# Patient Record
Sex: Male | Born: 1980 | Race: Black or African American | Hispanic: No | Marital: Single | Smoking: Current every day smoker
Health system: Southern US, Community
[De-identification: ages and names within clinical notes are randomized; demographics above are authoritative.]

---

## 2006-07-10 ENCOUNTER — Emergency Department: Payer: Self-pay | Admitting: Emergency Medicine

## 2006-07-20 ENCOUNTER — Emergency Department: Payer: Self-pay | Admitting: General Practice

## 2006-10-24 ENCOUNTER — Emergency Department: Payer: Self-pay | Admitting: Emergency Medicine

## 2010-09-27 ENCOUNTER — Emergency Department: Payer: Self-pay | Admitting: Emergency Medicine

## 2011-06-17 ENCOUNTER — Emergency Department: Payer: Self-pay | Admitting: Emergency Medicine

## 2019-07-12 ENCOUNTER — Encounter: Payer: Self-pay | Admitting: Emergency Medicine

## 2019-07-12 ENCOUNTER — Emergency Department
Admission: EM | Admit: 2019-07-12 | Discharge: 2019-07-12 | Disposition: A | Discharge status: Home / Self Care | Payer: Self-pay | Attending: Student in an Organized Health Care Education/Training Program | Admitting: Student in an Organized Health Care Education/Training Program

## 2019-07-12 ENCOUNTER — Other Ambulatory Visit: Payer: Self-pay

## 2019-07-12 DIAGNOSIS — L0201 Cutaneous abscess of face: Secondary | ICD-10-CM | POA: Insufficient documentation

## 2019-07-12 DIAGNOSIS — F1210 Cannabis abuse, uncomplicated: Secondary | ICD-10-CM | POA: Insufficient documentation

## 2019-07-12 DIAGNOSIS — F17210 Nicotine dependence, cigarettes, uncomplicated: Secondary | ICD-10-CM | POA: Insufficient documentation

## 2019-07-12 DIAGNOSIS — H01001 Unspecified blepharitis right upper eyelid: Secondary | ICD-10-CM | POA: Insufficient documentation

## 2019-07-12 MED ORDER — CEPHALEXIN 500 MG PO CAPS
500.0000 mg | ORAL_CAPSULE | Freq: Once | ORAL | Status: AC
Start: 2019-07-12 — End: 2019-07-12
  Administered 2019-07-12: 11:00:00 500 mg via ORAL
  Filled 2019-07-12: qty 1

## 2019-07-12 MED ORDER — CEPHALEXIN 500 MG PO CAPS: 500.0000 mg | ORAL_CAPSULE | Freq: Three times a day (TID) | ORAL | 0 refills | Status: DC

## 2019-07-12 NOTE — ED Triage Notes (Signed)
Patient states he thinks he has a boil beside right eye. Swelling noted to right eye. States he woke up yesterday and noticed the swelling. Patient reports he tried keeping a warm rag on it throughout the day yesterday with no improvement. Denies drainage from boil or eye.

## 2019-07-12 NOTE — ED Notes (Signed)
Provider in room to assess patient.  Will continue to monitor.   

## 2019-07-12 NOTE — ED Notes (Signed)
Patient presents to the ED with significant swelling to his right eye. Patient reports minimal pain.  Patient has a small raised area approx. 1/2 inch from his right eye.  Patient states eye swelling started yesterday.  Patient is in no obvious distress.

## 2019-07-12 NOTE — ED Provider Notes (Signed)
[facility] Emergency Department Provider Note ____________________________________________  Time seen: 1035  I have reviewed the triage vital signs and the nursing notes.  HISTORY  Chief Complaint  Eye Pain  HPI Roger Richard is a 38 y.o. male presents to the ED for evaluation of what he thinks is a boil beside his right eye.  Patient noted swelling to the right eye yesterday when he awoke.  He does admit to squeezing the area in an attempt to drain it.  Since that time he has had swelling to the upper lid in the right as well as some early swelling to the infraorbital region.  He denies any eye pain, difficulty seeing, eye drainage, nausea, vomiting, or dizziness.  Please apply warm compress throughout the day of denies any significant improvement.  Patient denies any preceding cough, cold, or flulike symptoms.   History reviewed. No pertinent past medical history.  There are no active problems to display for this patient.  History reviewed. No pertinent surgical history.  Prior to Admission medications   Medication Sig Start Date End Date Taking? Authorizing Provider  cephALEXin (KEFLEX) 500 MG capsule Take 1 capsule (500 mg total) by mouth 3 (three) times daily. 07/12/19   Roger Richard, Roger Karvonen, PA-C    Allergies Patient has no known allergies.  No family history on file.  Social History Social History   Tobacco Use  . Smoking status: Current Every Day Smoker    Packs/day: 0.50  . Smokeless tobacco: Never Used  Substance Use Topics  . Alcohol use: Yes    Comment: daily  . Drug use: Yes    Types: Marijuana    Review of Systems  Constitutional: Negative for fever. Eyes: Negative for visual changes.  Upper lid swelling on the right. ENT: Negative for sore throat. Cardiovascular: Negative for chest pain. Respiratory: Negative for shortness of breath. Gastrointestinal: Negative for abdominal pain, vomiting and diarrhea. Genitourinary:  Negative for dysuria. Musculoskeletal: Negative for back pain. Skin: Negative for rash.  Facial nodule is noted. Neurological: Negative for headaches, focal weakness or numbness. ____________________________________________  PHYSICAL EXAM:  VITAL SIGNS: ED Triage Vitals  Enc Vitals Group     BP 07/12/19 0946 (!) 172/94     Pulse Rate 07/12/19 0946 86     Resp 07/12/19 0946 16     Temp 07/12/19 0946 98.6 F (37 C)     Temp Source 07/12/19 0946 Oral     SpO2 07/12/19 0946 (!) 85 %     Weight 07/12/19 0947 150 lb (68 kg)     Height 07/12/19 0947 5\' 10"  (1.778 m)     Head Circumference --      Peak Flow --      Pain Score 07/12/19 0946 2     Pain Loc --      Pain Edu? --      Excl. in [place]? --     Constitutional: Alert and oriented. Well appearing and in no distress. Head: Normocephalic and atraumatic.  Patient with a small focal deep subcutaneous nodule to the right temple, just at the lateral border of the lateral canthus.  The area is mobile without superficial skin change, pointing, fluctuance, or punctum.  There is extension of some swelling to the right upper lid.   Eyes: Conjunctivae are normal, bilaterally. PERRL. Normal extraocular movements. No proptosis is appreciated. Neck: Supple. No thyromegaly. Hematological/Lymphatic/Immunological: Palpable right preauricular lymphadenopathy noted.  Possible bilateral anterior lymphadenopathy. Cardiovascular: Normal rate, regular rhythm. Normal distal  pulses. Respiratory: Normal respiratory effort. No wheezes/rales/rhonchi. Musculoskeletal: Nontender with normal range of motion in all extremities.  Neurologic:  Normal gait without ataxia. Normal speech and language. No gross focal neurologic deficits are appreciated. Skin:  Skin is warm, dry and intact. No rash noted. ____________________________________________  PROCEDURES  Keflex vomiting mg p.o. Procedures ____________________________________________  INITIAL IMPRESSION /  ASSESSMENT AND PLAN / ED COURSE  DDX: pre-septal cellulitis, conjunctivitis, facial abscess/cellulitis, erysipelas, cystic acne  ED evaluation of a facial abscess and mild spread of inflammation and irritation to the right upper lid.  Patient is without any indication of any proptosis, pain with active movement, or concern for preseptal cellulitis.  Clinical picture is consistent with a cutaneous abscess has been manipulated.  Patient be treated empirically with Keflex at this time patient encouraged to continue to apply warm compress to promote healing.  He will monitor closely for changes and return to the ED immediately for worsening symptoms as discussed.  Work note is provided for 2 days as requested.  [name] was evaluated in Emergency Department on 07/12/2019 for the symptoms described in the history of present illness. He was evaluated in the context of the global COVID-19 pandemic, which necessitated consideration that the patient might be at risk for infection with the SARS-CoV-2 virus that causes COVID-19. Institutional protocols and algorithms that pertain to the evaluation of patients at risk for COVID-19 are in a state of rapid change based on information released by regulatory bodies including the CDC and federal and state organizations. These policies and algorithms were followed during the patient's care in the ED. ____________________________________________  FINAL CLINICAL IMPRESSION(S) / ED DIAGNOSES  Final diagnoses:  Facial abscess  Blepharitis of right upper eyelid, unspecified type      Lissa Hoard, PA-C 07/12/19 1151    Willy Eddy, MD 07/12/19 1504

## 2019-07-12 NOTE — Discharge Instructions (Signed)
You are being treated for a small abscess and cellulitis in your face. Take the antibiotics as directed.

## 2019-09-11 ENCOUNTER — Other Ambulatory Visit: Payer: Self-pay

## 2019-09-11 ENCOUNTER — Emergency Department
Admission: EM | Admit: 2019-09-11 | Discharge: 2019-09-12 | Disposition: A | Discharge status: Home / Self Care | Payer: Self-pay | Attending: Student | Admitting: Student

## 2019-09-11 ENCOUNTER — Emergency Department: Payer: Self-pay

## 2019-09-11 DIAGNOSIS — W010XXA Fall on same level from slipping, tripping and stumbling without subsequent striking against object, initial encounter: Secondary | ICD-10-CM | POA: Insufficient documentation

## 2019-09-11 DIAGNOSIS — S73035A Other anterior dislocation of left hip, initial encounter: Secondary | ICD-10-CM | POA: Insufficient documentation

## 2019-09-11 DIAGNOSIS — Y9383 Activity, rough housing and horseplay: Secondary | ICD-10-CM | POA: Insufficient documentation

## 2019-09-11 DIAGNOSIS — S32402A Unspecified fracture of left acetabulum, initial encounter for closed fracture: Secondary | ICD-10-CM | POA: Insufficient documentation

## 2019-09-11 DIAGNOSIS — Y999 Unspecified external cause status: Secondary | ICD-10-CM | POA: Insufficient documentation

## 2019-09-11 DIAGNOSIS — Q7292 Unspecified reduction defect of left lower limb: Secondary | ICD-10-CM

## 2019-09-11 DIAGNOSIS — F17210 Nicotine dependence, cigarettes, uncomplicated: Secondary | ICD-10-CM | POA: Insufficient documentation

## 2019-09-11 DIAGNOSIS — Z79899 Other long term (current) drug therapy: Secondary | ICD-10-CM | POA: Insufficient documentation

## 2019-09-11 DIAGNOSIS — Y929 Unspecified place or not applicable: Secondary | ICD-10-CM | POA: Insufficient documentation

## 2019-09-11 MED ORDER — SODIUM CHLORIDE 0.9 % IV BOLUS
1000.0000 mL | Freq: Once | INTRAVENOUS | Status: AC
  Administered 2019-09-11: 1000 mL via INTRAVENOUS

## 2019-09-11 MED ORDER — KETAMINE HCL 10 MG/ML IJ SOLN
1.0000 mg/kg | Freq: Once | INTRAMUSCULAR | Status: AC
  Administered 2019-09-11: 73 mg via INTRAVENOUS
  Filled 2019-09-11: qty 1

## 2019-09-11 MED ORDER — OXYCODONE-ACETAMINOPHEN 5-325 MG PO TABS: 1.0000 | ORAL_TABLET | ORAL | 0 refills | Status: AC | PRN

## 2019-09-11 NOTE — Sedation Documentation (Signed)
PT sitting up in bed at this time.

## 2019-09-11 NOTE — Sedation Documentation (Signed)
Verbal order for DG hip portable unilateral w pelvis

## 2019-09-11 NOTE — ED Notes (Signed)
Pt taken to xray 

## 2019-09-11 NOTE — Sedation Documentation (Signed)
Md Enzo Montgomery EDT, Sam RN and Arboriculturist at bedside

## 2019-09-11 NOTE — ED Notes (Signed)
Pt stating he can't find his phone. This RN looked for phone in pt's belongings but did not find it. Pt given hospital phone to talk to mom. Mom states pt's phone is in his car.

## 2019-09-11 NOTE — ED Notes (Signed)
Mom called and updated on pt's status and plan of care.

## 2019-09-11 NOTE — Sedation Documentation (Signed)
X-ray notified of orders for hip and pelvis xray

## 2019-09-11 NOTE — Sedation Documentation (Signed)
MD Archie Balboa positioning pt with dorian EDT

## 2019-09-11 NOTE — ED Notes (Signed)
Pt signed consent for conscious sedation and relocation of left hip.  Code cart bought into room. Pt placed on zoll with zoll pads, pt hooked up to vital machine monitor including heart rate and blood pressure. Pt placed on 2L  with ECO2 monitoring. 20G peripheral IV placed in pt's right antecubital. Pt placed in gown at this time by Dorian RN.

## 2019-09-11 NOTE — Sedation Documentation (Addendum)
X-ray at bedside to verify placement of relocation of hip. Md Archie Balboa reviewed xray and confirmed placement.

## 2019-09-11 NOTE — ED Triage Notes (Signed)
Pt here from home via EMS.  Per EMS, pt was wrestling with his brother's today and fell and hurt his left hip and left leg. Pt unable to lift up left leg.

## 2019-09-11 NOTE — Sedation Documentation (Addendum)
Hip possibly relocated by MD Archie Balboa. Awaiting x-ray for confirmation.

## 2019-09-11 NOTE — Discharge Instructions (Signed)
Please use crutches to get around. You can touch your left toes to the ground but do not bear any weight until you are cleared to do so by orthopedics.

## 2019-09-11 NOTE — Sedation Documentation (Signed)
Pt is awake and mumbling/ laughing at this time. Pt still moving upper extremities.

## 2019-09-11 NOTE — ED Provider Notes (Signed)
[facility] Emergency Department Provider Note  ____________________________________________   I have reviewed the triage vital signs and the nursing notes.   HISTORY  Chief Complaint Hip Pain and Leg Pain   History limited by: Not Limited   HPI Roger Richard is a 38 y.o. male who presents to the emergency department today because of concerns for left hip pain.  He states that he had met up with some friends and started messing around with them.  He tried to pick up a heavier friend and then he fell down and that his friend fell on top of him on his left hip.  Since then he feels like he has not been able to move his left leg.  He denies any radiation of pain.  Denies any numbness down the left leg.  Patient denies any other injuries.  Records reviewed. Per medical record review patient has a history of abscess  History reviewed. No pertinent past medical history.  There are no problems to display for this patient.   History reviewed. No pertinent surgical history.  Prior to Admission medications   Medication Sig Start Date End Date Taking? Authorizing Provider  cephALEXin (KEFLEX) 500 MG capsule Take 1 capsule (500 mg total) by mouth 3 (three) times daily. 07/12/19   Menshew, Dannielle Karvonen, PA-C    Allergies Patient has no known allergies.  No family history on file.  Social History Social History   Tobacco Use  . Smoking status: Current Every Day Smoker    Packs/day: 0.50  . Smokeless tobacco: Never Used  Substance Use Topics  . Alcohol use: Yes    Comment: daily  . Drug use: Yes    Types: Marijuana    Review of Systems Constitutional: No fever/chills Eyes: No visual changes. ENT: No sore throat. Cardiovascular: Denies chest pain. Respiratory: Denies shortness of breath. Gastrointestinal: No abdominal pain.  No nausea, no vomiting.  No diarrhea.   Genitourinary: Negative for dysuria. Musculoskeletal: Positive for left leg  pain. Skin: Negative for rash. Neurological: Negative for headaches, focal weakness or numbness.  ____________________________________________   PHYSICAL EXAM:  VITAL SIGNS: ED Triage Vitals  Enc Vitals Group     BP 09/11/19 2045 (!) 128/112     Pulse Rate 09/11/19 2045 78     Resp 09/11/19 2045 18     Temp 09/11/19 2045 98 F (36.7 C)     Temp Source 09/11/19 2045 Oral     SpO2 09/11/19 2045 99 %     Weight 09/11/19 2043 160 lb (72.6 kg)     Height 09/11/19 2043 _0  (1.778 m)     Head Circumference --      Peak Flow --      Pain Score 09/11/19 2042 6   Constitutional: Alert and oriented.  Eyes: Conjunctivae are normal.  ENT      Head: Normocephalic and atraumatic.      Nose: No congestion/rhinnorhea.      Mouth/Throat: Mucous membranes are moist.      Neck: No stridor. Hematological/Lymphatic/Immunilogical: No cervical lymphadenopathy. Cardiovascular: Normal rate, regular rhythm.  No murmurs, rubs, or gallops.  Respiratory: Normal respiratory effort without tachypnea nor retractions. Breath sounds are clear and equal bilaterally. No wheezes/rales/rhonchi. Gastrointestinal: Soft and non tender. No rebound. No guarding.  Genitourinary: Deferred Musculoskeletal: Slight outward rotation of left leg and shortening of left leg Neurologic:  Normal speech and language. No gross focal neurologic deficits are appreciated.  Skin:  Skin is warm, dry  and intact. No rash noted. Psychiatric: Mood and affect are normal. Speech and behavior are normal. Patient exhibits appropriate insight and judgment.  ____________________________________________    LABS (pertinent positives/negatives)  None  ____________________________________________   EKG  None  ____________________________________________    RADIOLOGY  Left hip x-ray Superior/anterior left hip dislocation with acetabular fracture  Left hip x-ray Reduction of left hip  dislocation   ____________________________________________   PROCEDURES  .Sedation  Date/Time: 09/11/2019 11:47 PM Performed by: Nance Pear, MD Authorized by: Nance Pear, MD   Consent:    Consent obtained:  Written (electronic informed consent)   Consent given by:  Patient   Risks discussed:  Allergic reaction, dysrhythmia, inadequate sedation, nausea, vomiting, respiratory compromise necessitating ventilatory assistance and intubation, prolonged sedation necessitating reversal and prolonged hypoxia resulting in organ damage   Alternatives discussed:  Analgesia without sedation Universal protocol:    Procedure explained and questions answered to patient or proxy's satisfaction: yes     Relevant documents present and verified: yes     Test results available and properly labeled: yes     Imaging studies available: yes     Required blood products, implants, devices, and special equipment available: yes     Immediately prior to procedure a time out was called: yes     Patient identity confirmation method:  Arm band Indications:    Procedure performed:  Dislocation reduction   Procedure necessitating sedation performed by:  Physician performing sedation Pre-sedation assessment:    Time since last food or drink:  Unknown   NPO status caution: urgency dictates proceeding with non-ideal NPO status     ASA classification: class 1 - normal, healthy patient     Neck mobility: normal     Mouth opening:  2 finger widths   Mallampati score:  II - soft palate, uvula, fauces visible   Pre-sedation assessments completed and reviewed: airway patency, cardiovascular function, hydration status, mental status, nausea/vomiting, pain level, respiratory function and temperature   Immediate pre-procedure details:    Reassessment: Patient reassessed immediately prior to procedure     Reviewed: vital signs, relevant labs/tests and NPO status     Verified: bag valve mask available, emergency  equipment available, intubation equipment available, IV patency confirmed, oxygen available, reversal medications available and suction available   Procedure details (see MAR for exact dosages):    Preoxygenation:  Nasal cannula   Sedation:  Ketamine   Intended level of sedation: deep   Intra-procedure monitoring:  Blood pressure monitoring, continuous pulse oximetry, cardiac monitor, frequent vital sign checks and frequent LOC assessments   Intra-procedure events: none     Total Provider sedation time (minutes):  20 Post-procedure details:    Attendance: Constant attendance by certified staff until patient recovered     Recovery: Patient returned to pre-procedure baseline     Post-sedation assessments completed and reviewed: airway patency, cardiovascular function, hydration status, mental status and respiratory function     Patient is stable for discharge or admission: yes     Patient tolerance:  Tolerated well, no immediate complications   Reduction of dislocation Date/Time: 11:49 PM Performed by: Nance Pear Authorized by: Nance Pear Consent: Verbal consent obtained. Risks and benefits: risks, benefits and alternatives were discussed Consent given by: patient Required items: required blood products, implants, devices, and special equipment available Time out: Immediately prior to procedure a "time out" was called to verify the correct patient, procedure, equipment, support staff and site/side marked as required.  Patient sedated: Ketamine  Vitals: Vital signs were monitored during sedation. Patient tolerance: Patient tolerated the procedure well with no immediate complications. Joint: Left hip Reduction technique: Traction countertraction   ____________________________________________   INITIAL IMPRESSION / ASSESSMENT AND PLAN / ED COURSE  Pertinent labs & imaging results that were available during my care of the patient were reviewed by me and considered in my  medical decision making (see chart for details).   Patient presented to the emergency department today because of concerns for left hip pain after falling and having a heavier person landed on it.  X-rays are consistent with a dislocation with acetabular fracture.  This was reduced under sedation.  Repeat x-rays showed a successful reduction.  Discussed with Dr. Marry Guan with orthopedics. Will obtain CT scan to evaluate for any intra-articular bony fragments.  If negative will plan on following up with orthopedics.  ____________________________________________   FINAL CLINICAL IMPRESSION(S) / ED DIAGNOSES  Final diagnoses:  Anterior dislocation of left hip, initial encounter ([place])  Closed displaced fracture of left acetabulum, unspecified portion of acetabulum, initial encounter [facility])     Note: This dictation was prepared with Dragon dictation. Any transcriptional errors that result from this process are unintentional     Nance Pear, MD 09/11/19 2352

## 2019-09-11 NOTE — Sedation Documentation (Signed)
MD Archie Balboa attempting relocation

## 2019-09-12 MED ORDER — ACETAMINOPHEN 500 MG PO TABS
1000.0000 mg | ORAL_TABLET | Freq: Once | ORAL | Status: AC
  Administered 2019-09-12: 01:00:00 1000 mg via ORAL
  Filled 2019-09-12: qty 2

## 2019-09-12 MED ORDER — OXYCODONE HCL 5 MG PO TABS
5.0000 mg | ORAL_TABLET | Freq: Once | ORAL | Status: AC
  Administered 2019-09-12: 5 mg via ORAL
  Filled 2019-09-12: qty 1

## 2019-09-12 NOTE — ED Provider Notes (Signed)
CT hip, post reduction:  IMPRESSION:  Comminuted mildly displaced acetabular fracture involving the anterior wall and column.  Small hip joint effusion. Iliopsoas muscular edema with a small amount of subcutaneous emphysema.   CT hip reviewed by Dr. Marry Guan.  Patient okay for discharge with crutches.  Toe-touch, but otherwise non-weightbearing.  He will follow up in the orthopedic clinic as an outpatient.   Lilia Pro., MD 09/12/19 [phone]

## 2019-09-12 NOTE — ED Notes (Signed)
Pt back to baseline mentation, understand discharge instructions with verbal readback and able to sign for self.  Patient wheeled out to his mother for discharge transport by this RN.

## 2019-09-12 NOTE — ED Notes (Signed)
This RN spoke w/ pt's mother to arrange transportation for discharge.

## 2020-03-19 ENCOUNTER — Telehealth: Payer: Self-pay | Admitting: General Practice

## 2020-03-19 NOTE — Telephone Encounter (Signed)
Individual has been contacted 3+ times regarding ED referral and has been given information regarding the clinic. No further attempts to contact the individual will be made. 

## 2020-06-08 IMAGING — CT CT HIP*L* W/O CM
2 of 4 series · 17 of 46 positions shown, 19 images · non-contrast
Comparison: None.

CLINICAL DATA: Injury to the left hip

EXAM:
CT OF THE LEFT HIP WITHOUT CONTRAST
TECHNIQUE: Multidetector CT imaging of the left hip was performed according to
the standard protocol. Multiplanar CT image reconstructions were
also generated.

[Series 3: axial st · axial · 0.46mm/px · z∈[-1347,-1183]mm · 14 of 96 slices shown, 16 images]
[im 7/96  soft-tissue]
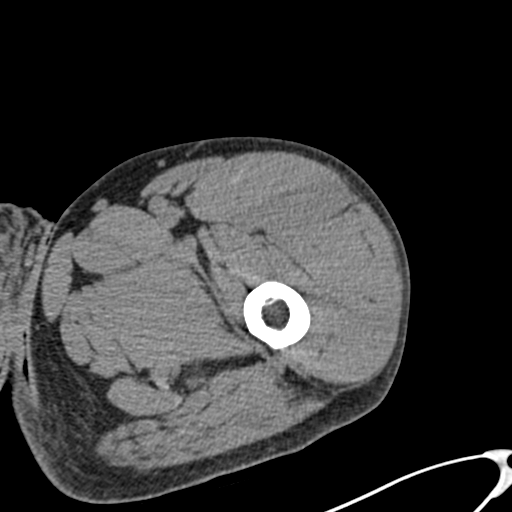
[im 7/96  bone]
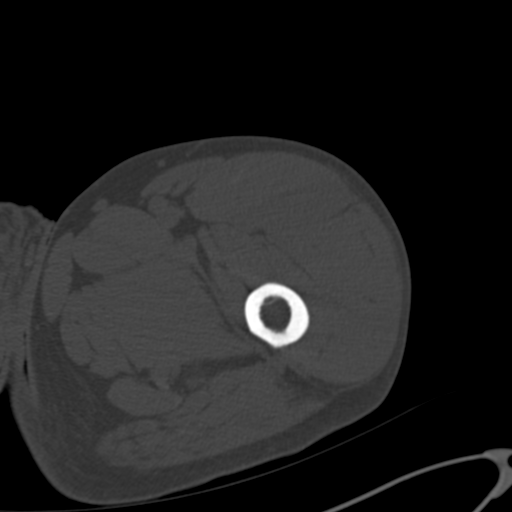
[im 13/96  soft-tissue]
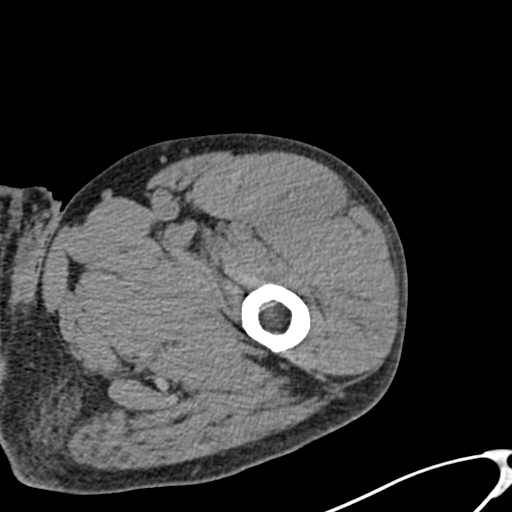
[im 19/96  soft-tissue]
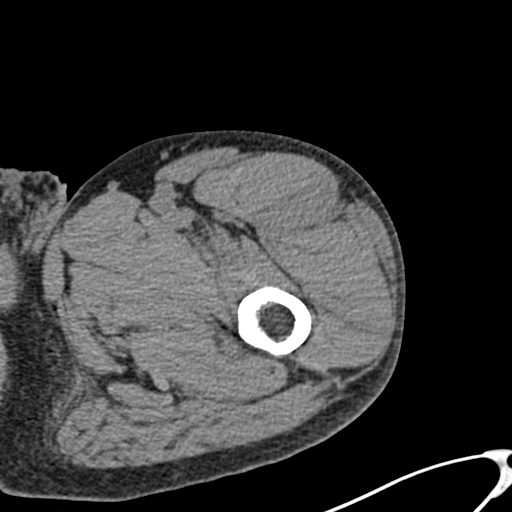
[im 25/96  soft-tissue]
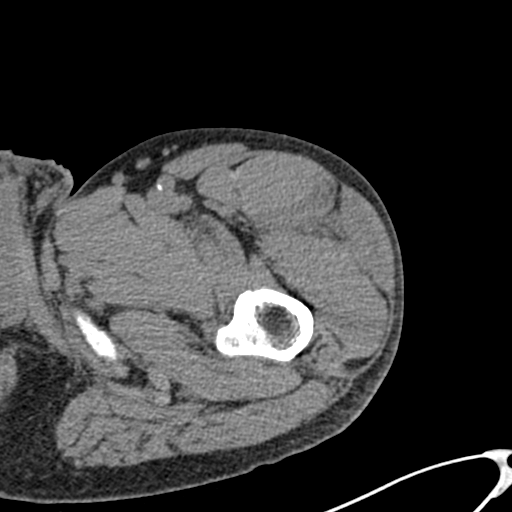
[im 31/96  soft-tissue]
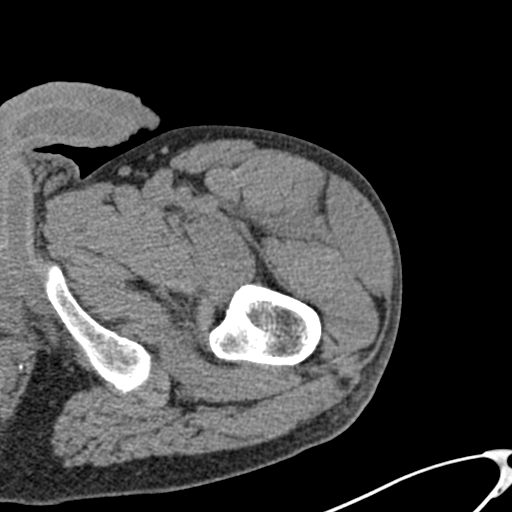
[im 37/96  soft-tissue]
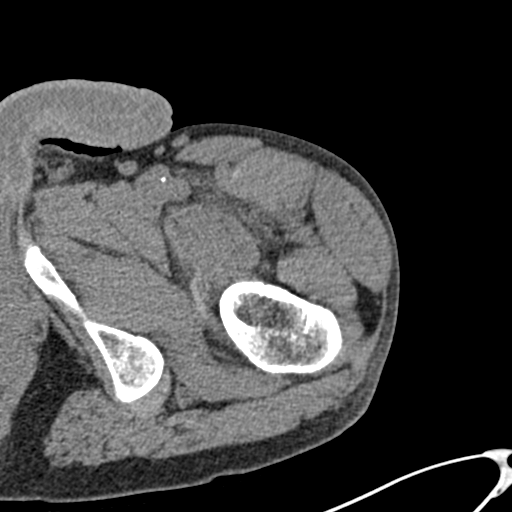
[im 43/96  soft-tissue]
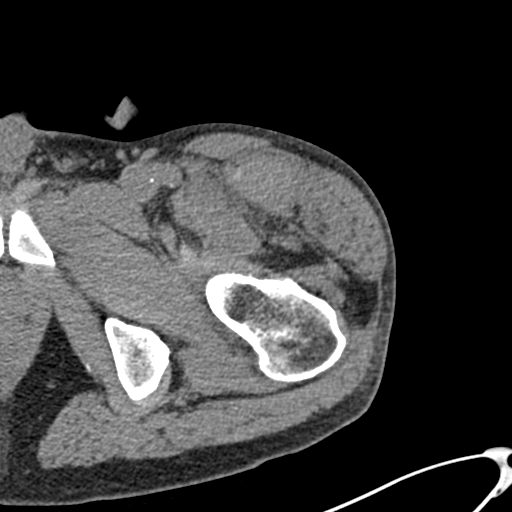
[im 53/96  soft-tissue]
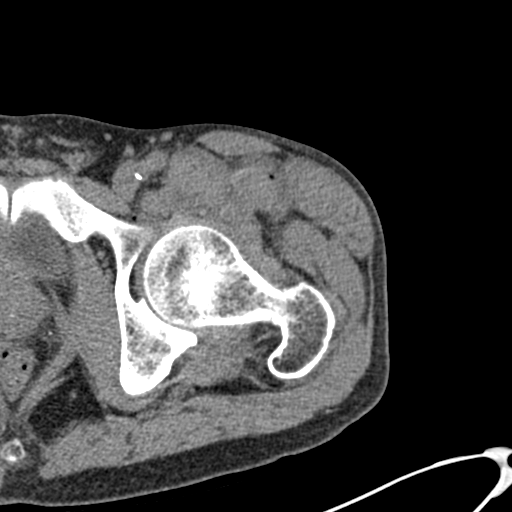
[im 59/96  soft-tissue]
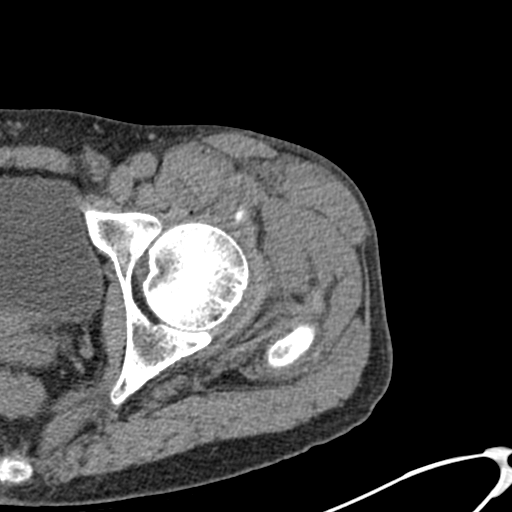
[im 59/96  bone]
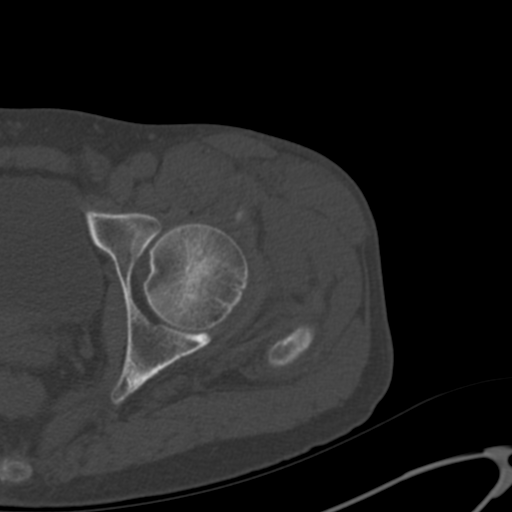
[im 65/96  soft-tissue]
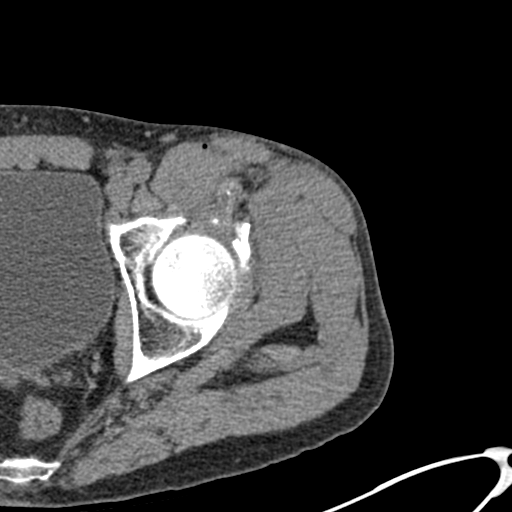
[im 71/96  soft-tissue]
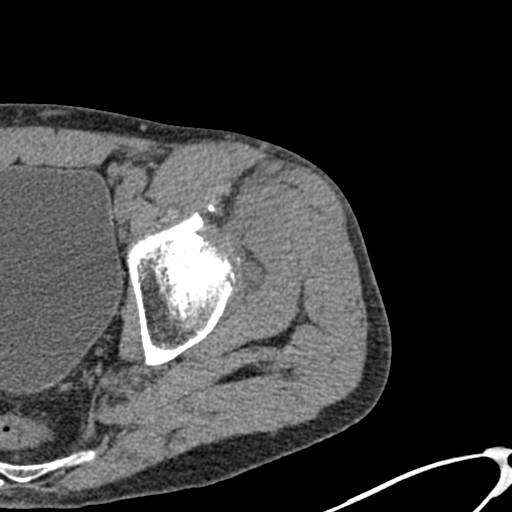
[im 77/96  soft-tissue]
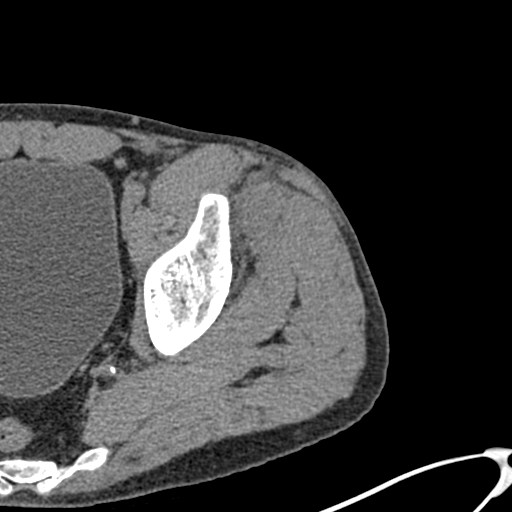
[im 83/96  soft-tissue]
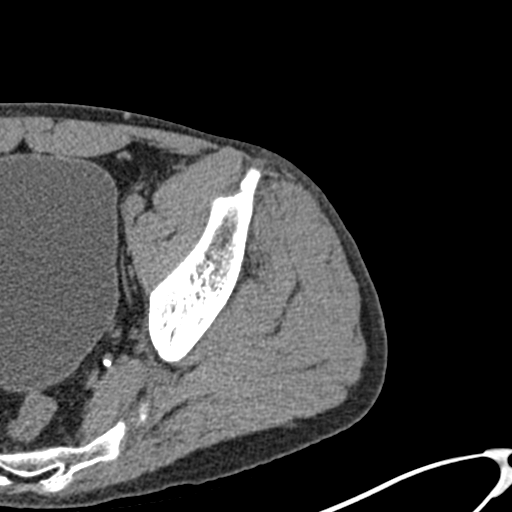
[im 89/96  soft-tissue]
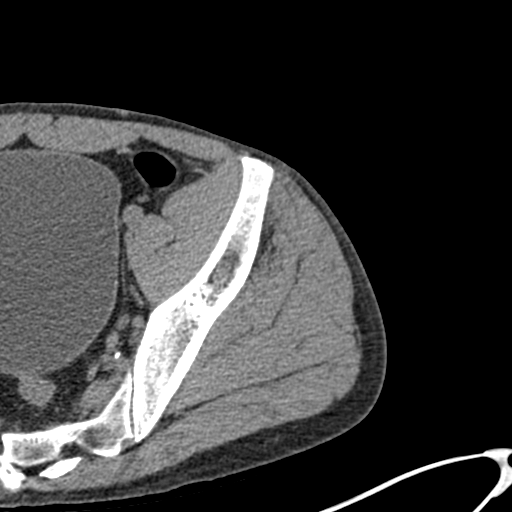

[Series 6: coronal st · coronal · 0.39mm/px · 3 of 87 slices shown]
[im 22/87  soft-tissue]
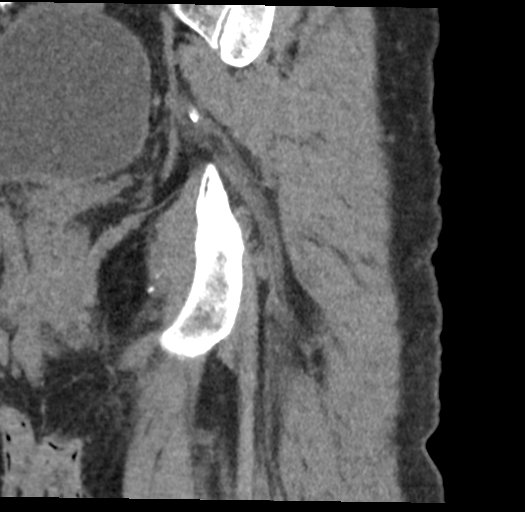
[im 44/87  soft-tissue]
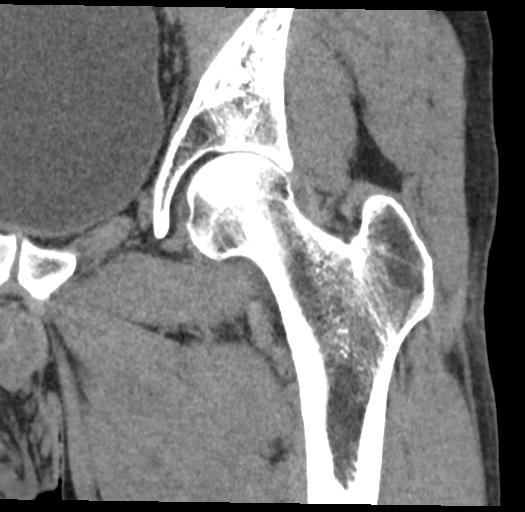
[im 65/87  soft-tissue]
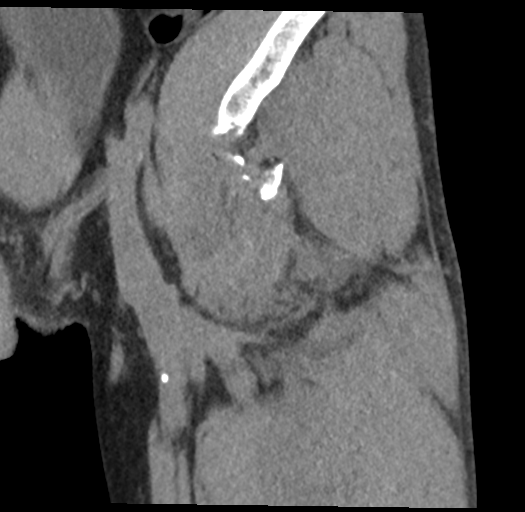

[17 of 46 positions shown; findings below may reference images not displayed]

FINDINGS: Bones/Joint/Cartilage

There is comminuted mildly displaced acetabular fracture involving
the anterior wall and column. Small fracture fragments are seen
within the anterior soft tissues. The femoral head is still well
seated within the acetabulum. No other osseous fracture seen. There
is a small hip joint effusion.

Ligaments

Suboptimally assessed by CT.

Muscles and Tendons

A small amount of subcutaneous emphysema seen in the anterior
muscular compartment. There is heterogeneous signal seen within the
iliopsoas musculature, however does appear to be intact. The
remainder of the muscles are intact. The tendons appear to be
intact.

Soft tissues

Mild soft tissue swelling seen overlying the anterior hip. Scattered
vascular calcifications are seen.
IMPRESSION: Comminuted mildly displaced acetabular fracture involving the
anterior wall and column.

Small hip joint effusion

Iliopsoas muscular edema with a small amount of subcutaneous
emphysema.

## 2020-06-08 IMAGING — DX DG HIP (WITH OR WITHOUT PELVIS) 2-3V*L*
2 series · 2 of 2 positions shown · non-contrast
Comparison: Radiographs 09/11/2019

CLINICAL DATA: Post reduction

EXAM:
DG HIP (WITH OR WITHOUT PELVIS) 2-3V LEFT

[pelvis ap]
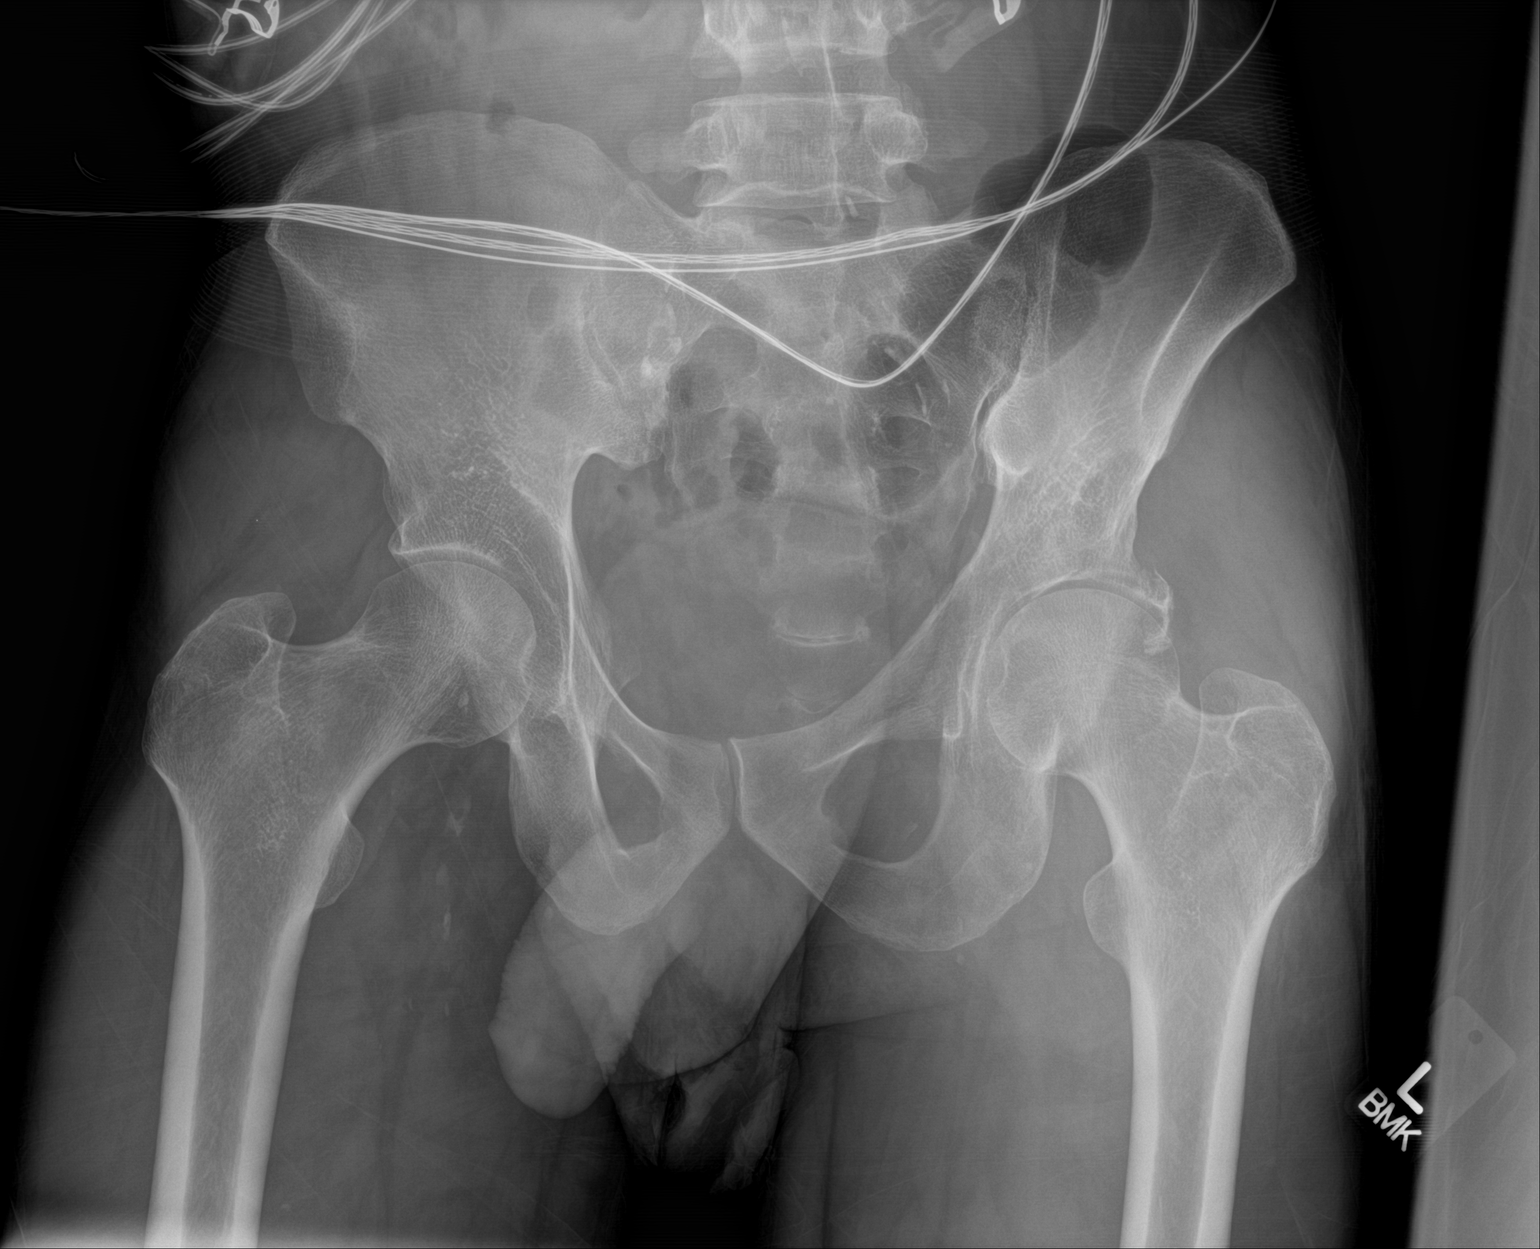

[hip lat]
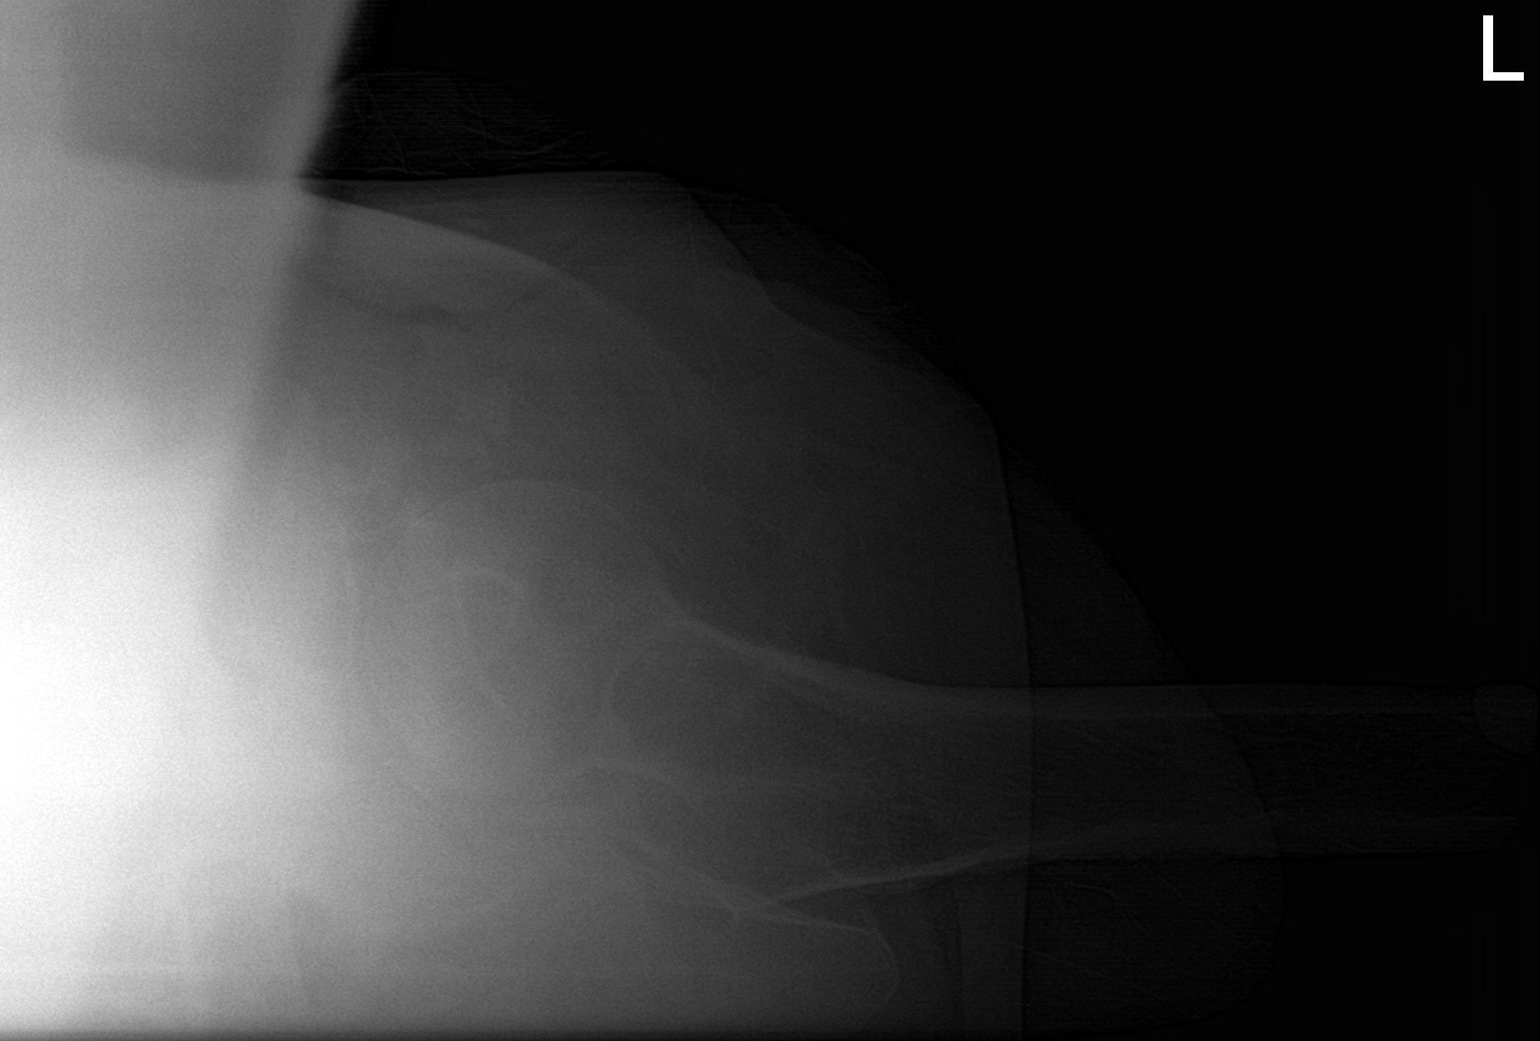

[2 of 2 positions shown; findings below may reference images not displayed]

FINDINGS: Interval reduction of the left hip dislocation with the femoral head
now normally seated within the acetabulum. There is decreased
displacement of the superior acetabular comminuted fracture
fragments as well. Persistent left hip effusion and overlying soft
tissue swelling is noted. No new or additional fractures are seen.
Vascular calcium again seen in the pelvis.
IMPRESSION: Interval reduction of the left hip dislocation with the femoral head
now normally seated within the acetabulum.

Decreased displacement of the comminuted superior acetabular
fracture fragments as well.

## 2021-06-26 ENCOUNTER — Encounter: Payer: Self-pay | Admitting: Emergency Medicine

## 2021-06-26 ENCOUNTER — Emergency Department
Admission: EM | Admit: 2021-06-26 | Discharge: 2021-06-27 | Disposition: A | Discharge status: Home / Self Care | Payer: Self-pay | Attending: Emergency Medicine | Admitting: Emergency Medicine

## 2021-06-26 ENCOUNTER — Other Ambulatory Visit: Payer: Self-pay

## 2021-06-26 DIAGNOSIS — E876 Hypokalemia: Secondary | ICD-10-CM | POA: Insufficient documentation

## 2021-06-26 DIAGNOSIS — R7401 Elevation of levels of liver transaminase levels: Secondary | ICD-10-CM | POA: Insufficient documentation

## 2021-06-26 DIAGNOSIS — F17210 Nicotine dependence, cigarettes, uncomplicated: Secondary | ICD-10-CM | POA: Insufficient documentation

## 2021-06-26 DIAGNOSIS — E162 Hypoglycemia, unspecified: Secondary | ICD-10-CM | POA: Insufficient documentation

## 2021-06-26 LAB — CBG MONITORING, ED
Glucose-Capillary: 139 mg/dL — ABNORMAL HIGH (ref 70–99)
Glucose-Capillary: 206 mg/dL — ABNORMAL HIGH (ref 70–99)
Glucose-Capillary: 40 mg/dL — CL (ref 70–99)
Glucose-Capillary: 46 mg/dL — ABNORMAL LOW (ref 70–99)

## 2021-06-26 LAB — COMPREHENSIVE METABOLIC PANEL
ALT: 88 U/L — ABNORMAL HIGH (ref 0–44)
AST: 168 U/L — ABNORMAL HIGH (ref 15–41)
Albumin: 3.4 g/dL — ABNORMAL LOW (ref 3.5–5.0)
Alkaline Phosphatase: 94 U/L (ref 38–126)
Anion gap: 17 — ABNORMAL HIGH (ref 5–15)
BUN: 7 mg/dL (ref 6–20)
CO2: 25 mmol/L (ref 22–32)
Calcium: 8.3 mg/dL — ABNORMAL LOW (ref 8.9–10.3)
Chloride: 99 mmol/L (ref 98–111)
Creatinine, Ser: 0.73 mg/dL (ref 0.61–1.24)
GFR, Estimated: >60 mL/min (ref >60)
Glucose, Bld: 37 mg/dL — CL (ref 70–99)
Potassium: 3.2 mmol/L — ABNORMAL LOW (ref 3.5–5.1)
Sodium: 141 mmol/L (ref 135–145)
Total Bilirubin: 0.9 mg/dL (ref 0.3–1.2)
Total Protein: 6.7 g/dL (ref 6.5–8.1)

## 2021-06-26 LAB — CBC
HCT: 44.9 % (ref 39.0–52.0)
Hemoglobin: 16 g/dL (ref 13.0–17.0)
MCH: 32.4 pg (ref 26.0–34.0)
MCHC: 35.6 g/dL (ref 30.0–36.0)
MCV: 90.9 fL (ref 80.0–100.0)
Platelets: 126 10*3/uL — ABNORMAL LOW (ref 150–400)
RBC: 4.94 MIL/uL (ref 4.22–5.81)
RDW: 15.2 % (ref 11.5–15.5)
WBC: 6.9 10*3/uL (ref 4.0–10.5)
nRBC: 0.4 % — ABNORMAL HIGH (ref 0.0–0.2)

## 2021-06-26 MED ORDER — DEXTROSE 50 % IV SOLN
1.0000 | Freq: Once | INTRAVENOUS | Status: AC
  Administered 2021-06-26: 50 mL via INTRAVENOUS

## 2021-06-26 MED ORDER — DEXTROSE 50 % IV SOLN
INTRAVENOUS | Status: AC
  Filled 2021-06-26: qty 50

## 2021-06-26 MED ORDER — POTASSIUM CHLORIDE CRYS ER 20 MEQ PO TBCR
40.0000 meq | EXTENDED_RELEASE_TABLET | Freq: Once | ORAL | Status: AC
  Administered 2021-06-26: 40 meq via ORAL
  Filled 2021-06-26: qty 2

## 2021-06-26 NOTE — Discharge Instructions (Addendum)
As we discussed, your symptoms earlier tonight were the result of having a very low blood glucose (a condition called hypoglycemia).  This is likely due to a few different reasons, but most directly the result of your daily consumption of beer and not enough food.  If you start to feel similar symptoms again in the future, please eat or drink something with calories, preferably something like a [place] sandwich, orange juice, etc.  This should help you feel better, but please return immediately to the emergency department if you develop new or worsening symptoms.  Additionally, please consider cutting back on the amount of beer you drink daily and read through the included information about alcohol abuse and nutrition and how to prevent hypoglycemia in the future.

## 2021-06-26 NOTE — ED Provider Notes (Signed)
[facility] Emergency Department Provider Note  ____________________________________________   Event Date/Time   First MD Initiated Contact with Patient 06/26/21 2303     (approximate)  I have reviewed the triage vital signs and the nursing notes.   HISTORY  Chief Complaint Weakness, Dizziness, and Diaphoresis    HPI Roger Richard is a 40 y.o. male with no reported chronic medical issues but he will reports that he drinks 3-4 16-oz beers a day.  He presents for evaluation of lightheadedness, shakiness, and heavy sweating.  He said that he had not had anything to eat today and he helped a friend move so he was exerting himself a lot.  By this evening he started to feel a cute onset and severe symptoms as described above.  He has not felt like this previously and folic he should get checked out.  He was found to have a fingerstick blood glucose of 40, so he was given some D50 and some food and juice to eat and drink.  He said that now he feels completely normal and is ready to go.  He denies ever having any pain.  He denies nausea and vomiting.  He said that he is not taking any medications.  He smokes tobacco and marijuana on a regular basis and drinks beer daily as described above although he said he has only had one 16 ounce beer today.  He is no longer tremulous and has not had any visual or auditory hallucinations.  No history of seizures.  He specifically denies having any abdominal pain or recent vomiting or diarrhea.  Getting something to eat and getting the D50 made him feel better, nothing particular made him feel worse.     History reviewed. No pertinent past medical history.  There are no problems to display for this patient.   History reviewed. No pertinent surgical history.  Prior to Admission medications   Medication Sig Start Date End Date Taking? Authorizing Provider  cephALEXin (KEFLEX) 500 MG capsule Take 1 capsule (500 mg total) by  mouth 3 (three) times daily. Patient not taking: Reported on 06/26/2021 07/12/19   Menshew, Charlesetta Ivory, PA-C    Allergies Patient has no known allergies.  History reviewed. No pertinent family history.  Social History Social History   Tobacco Use   Smoking status: Every Day    Packs/day: 0.50    Types: Cigarettes   Smokeless tobacco: Never  Substance Use Topics   Alcohol use: Yes    Alcohol/week: 4.0 standard drinks    Types: 4 Cans of beer per week    Comment: daily (3-4 16-oz cans of beer daily)   Drug use: Yes    Types: Marijuana    Review of Systems Constitutional: No fever/chills Eyes: No visual changes. ENT: No sore throat. Cardiovascular: Denies chest pain.  Near syncopal earlier, resolved after food. Respiratory: Denies shortness of breath. Gastrointestinal: No abdominal pain.  No nausea, no vomiting.  No diarrhea.  No constipation. Genitourinary: Negative for dysuria. Musculoskeletal: Negative for neck pain.  Negative for back pain. Integumentary: Negative for rash.  Diaphoretic earlier, resolved after food. Neurological: Shaky earlier, resolved after food.  Negative for headaches, focal weakness or numbness.   ____________________________________________   PHYSICAL EXAM:  VITAL SIGNS: ED Triage Vitals  Enc Vitals Group     BP 06/26/21 2201 (!) 122/99     Pulse Rate 06/26/21 2201 64     Resp 06/26/21 2201 20     Temp --  Temp src --      SpO2 06/26/21 2201 99 %     Weight 06/26/21 2213 63.5 kg (140 lb)     Height 06/26/21 2213 1.778 m (5\' 10" )     Head Circumference --      Peak Flow --      Pain Score 06/26/21 2213 0     Pain Loc --      Pain Edu? --      Excl. in GC? --     Constitutional: Alert and oriented.  Eyes: Conjunctivae are normal.  Head: Atraumatic. Nose: No congestion/rhinnorhea. Mouth/Throat: Patient is wearing a mask. Neck: No stridor.  No meningeal signs.   Cardiovascular: Normal rate, regular rhythm. Good peripheral  circulation. Respiratory: Normal respiratory effort.  No retractions. Gastrointestinal: Soft and nontender. No distention.  Musculoskeletal: No lower extremity tenderness nor edema. No gross deformities of extremities. Neurologic:  Normal speech and language. No gross focal neurologic deficits are appreciated.  No tremor.  No asterixis. Skin:  Skin is warm, dry and intact. Psychiatric: Mood and affect are normal. Speech and behavior are normal.  ____________________________________________   LABS (all labs ordered are listed, but only abnormal results are displayed)  Labs Reviewed  CBC - Abnormal; Notable for the following components:      Result Value   Platelets 126 (*)    nRBC 0.4 (*)    All other components within normal limits  COMPREHENSIVE METABOLIC PANEL - Abnormal; Notable for the following components:   Potassium 3.2 (*)    Glucose, Bld 37 (*)    Calcium 8.3 (*)    Albumin 3.4 (*)    AST 168 (*)    ALT 88 (*)    Anion gap 17 (*)    All other components within normal limits  CBG MONITORING, ED - Abnormal; Notable for the following components:   Glucose-Capillary 40 (*)    All other components within normal limits  CBG MONITORING, ED - Abnormal; Notable for the following components:   Glucose-Capillary 46 (*)    All other components within normal limits  CBG MONITORING, ED - Abnormal; Notable for the following components:   Glucose-Capillary 139 (*)    All other components within normal limits  CBG MONITORING, ED - Abnormal; Notable for the following components:   Glucose-Capillary 206 (*)    All other components within normal limits   ____________________________________________  EKG  ED ECG REPORT I, 2214, the attending physician, personally viewed and interpreted this ECG.  Date: 06/26/2021 EKG Time: 21:53 Rate: 69 Rhythm: sinus rhythm with marked sinus arrhythmia QRS Axis: normal Intervals: LVH, otherwise normal ST/T Wave abnormalities:  Non-specific ST segment / T-wave changes, but no clear evidence of acute ischemia. Narrative Interpretation: no definitive evidence of acute ischemia; does not meet STEMI criteria.  ____________________________________________   INITIAL IMPRESSION / MDM / ASSESSMENT AND PLAN / ED COURSE  As part of my medical decision making, I reviewed the following data within the electronic MEDICAL RECORD NUMBER Nursing notes reviewed and incorporated, Labs reviewed , EKG interpreted , Old chart reviewed, and Notes from prior ED visits   Differential diagnosis includes, but is not limited to, hypoglycemia, beer potomania syndrome, alcoholic ketoacidosis, nonspecific electrolyte or metabolic abnormality not otherwise specified, medication or drug side effect.  Patient is acting appropriate and is currently asymptomatic.  Blood glucose level was profoundly  low initially including a glucose of 37 on his comprehensive metabolic panel.  However subsequent fingerstick glucose level after  treatment and oral intake showed a glucose of 139 approximately 30 minutes ago.   His comprehensive metabolic panel was also notable for a decreased potassium of 3.2, slightly elevated AST and ALT, and an anion gap of 17.  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   I talked with him about this possibility, his eating habits and level of nutrition, and recommendation for additional monitoring and treatment.  However, the patient says that he feels fine and is ready to go home.  He is sober and has the capacity to make his own decisions.  I explained that if he goes home too soon he may become symptomatic again and have to come back and that it can be life-threatening based on how low his blood sugar was earlier tonight, and he said that is fine but he is able to eat and knows the importance now of eating something as soon as he starts to feel symptomatic.  We agreed that we will check a  repeat fingerstick blood glucose level to see if it has dropped significantly in the last 30 minutes.  If not, the patient prefers to go home, and given that he has a capacity make his own decisions, I will discharge him as per his request but with nutritional recommendations.       Clinical Course as of 06/26/21 2351  Thu Jun 26, 2021  2325 Ordering K+ 40 meq PO to begin replacement. [CF]  2350 Glucose-Capillary(!): 206 Patient's POC blood glucose is 206 which is reassuring.  Patient still wants to go home.  I provided some information for him to read, information about [facility], and gave my usual and customary return cautions. [CF]    Clinical Course User Index [CF] Loleta Rose, MD     ____________________________________________  FINAL CLINICAL IMPRESSION(S) / ED DIAGNOSES  Final diagnoses:  Hypoglycemia     MEDICATIONS GIVEN DURING THIS VISIT:  Medications  dextrose 50 % solution 50 mL (0 mLs Intravenous Hold 06/26/21 2238)  potassium chloride SA (KLOR-CON) CR tablet 40 mEq (40 mEq Oral Given 06/26/21 2345)     ED Discharge Orders     None        Note:  This document was prepared using Dragon voice recognition software and may include unintentional dictation errors.   Loleta Rose, MD 06/26/21 2351

## 2021-06-26 NOTE — ED Notes (Signed)
Dr. Derrill Kay aware that blood glucose per lab is 37.

## 2021-06-26 NOTE — ED Notes (Signed)
Per MD goodman, okay to give snacks. Pt given graham crackers, PB & applesauce. States he feels much better with increased blood sugar.

## 2021-06-26 NOTE — ED Triage Notes (Signed)
Pt to ED via POV with c/o dizziness that started earlier today, pt states was outside earlier today, then started to have dizziness and just PTA noticed that he was becoming sweaty, upon arrival to triage room pt with extreme diaphoresis to head/neck area, increasingly diaphoretic during triage, however patient remains alert and able to answer questions, remains oreinted x 4.   When CBG obtained by this RN pt's CBG noted to be 40, EDP goodman made aware pt provided 8oz apple juice and meal tray, pt states has not eaten today.

## 2022-04-07 ENCOUNTER — Encounter: Payer: Self-pay | Admitting: Medical Oncology

## 2022-04-07 ENCOUNTER — Other Ambulatory Visit: Payer: Self-pay

## 2022-04-07 ENCOUNTER — Emergency Department: Payer: Self-pay

## 2022-04-07 ENCOUNTER — Emergency Department
Admission: EM | Admit: 2022-04-07 | Discharge: 2022-04-07 | Disposition: A | Discharge status: Home / Self Care | Payer: Self-pay | Attending: Emergency Medicine | Admitting: Emergency Medicine

## 2022-04-07 DIAGNOSIS — D649 Anemia, unspecified: Secondary | ICD-10-CM | POA: Insufficient documentation

## 2022-04-07 DIAGNOSIS — R7309 Other abnormal glucose: Secondary | ICD-10-CM | POA: Insufficient documentation

## 2022-04-07 DIAGNOSIS — M7989 Other specified soft tissue disorders: Secondary | ICD-10-CM

## 2022-04-07 DIAGNOSIS — E876 Hypokalemia: Secondary | ICD-10-CM | POA: Insufficient documentation

## 2022-04-07 DIAGNOSIS — R600 Localized edema: Secondary | ICD-10-CM | POA: Insufficient documentation

## 2022-04-07 DIAGNOSIS — F17210 Nicotine dependence, cigarettes, uncomplicated: Secondary | ICD-10-CM | POA: Insufficient documentation

## 2022-04-07 LAB — URINALYSIS, ROUTINE W REFLEX MICROSCOPIC
Bilirubin Urine: NEGATIVE
Glucose, UA: NEGATIVE mg/dL
Hgb urine dipstick: NEGATIVE
Ketones, ur: NEGATIVE mg/dL
Leukocytes,Ua: NEGATIVE
Nitrite: NEGATIVE
Protein, ur: NEGATIVE mg/dL
Specific Gravity, Urine: 1.001 — ABNORMAL LOW (ref 1.005–1.030)
pH: 6 (ref 5.0–8.0)

## 2022-04-07 LAB — COMPREHENSIVE METABOLIC PANEL
ALT: 18 U/L (ref 0–44)
AST: 28 U/L (ref 15–41)
Albumin: 2.6 g/dL — ABNORMAL LOW (ref 3.5–5.0)
Alkaline Phosphatase: 42 U/L (ref 38–126)
Anion gap: 5 (ref 5–15)
BUN: 6 mg/dL (ref 6–20)
CO2: 25 mmol/L (ref 22–32)
Calcium: 8.1 mg/dL — ABNORMAL LOW (ref 8.9–10.3)
Chloride: 108 mmol/L (ref 98–111)
Creatinine, Ser: 0.7 mg/dL (ref 0.61–1.24)
GFR, Estimated: >60 mL/min (ref >60)
Glucose, Bld: 129 mg/dL — ABNORMAL HIGH (ref 70–99)
Potassium: 3.4 mmol/L — ABNORMAL LOW (ref 3.5–5.1)
Sodium: 138 mmol/L (ref 135–145)
Total Bilirubin: 0.4 mg/dL (ref 0.3–1.2)
Total Protein: 5.1 g/dL — ABNORMAL LOW (ref 6.5–8.1)

## 2022-04-07 LAB — CBC WITH DIFFERENTIAL/PLATELET
Abs Immature Granulocytes: 0.01 10*3/uL (ref 0.00–0.07)
Basophils Absolute: 0.1 10*3/uL (ref 0.0–0.1)
Basophils Relative: 1 %
Eosinophils Absolute: 0.3 10*3/uL (ref 0.0–0.5)
Eosinophils Relative: 5 %
HCT: 37.2 % — ABNORMAL LOW (ref 39.0–52.0)
Hemoglobin: 12.5 g/dL — ABNORMAL LOW (ref 13.0–17.0)
Immature Granulocytes: 0 %
Lymphocytes Relative: 38 %
Lymphs Abs: 2 10*3/uL (ref 0.7–4.0)
MCH: 30.6 pg (ref 26.0–34.0)
MCHC: 33.6 g/dL (ref 30.0–36.0)
MCV: 91 fL (ref 80.0–100.0)
Monocytes Absolute: 0.3 10*3/uL (ref 0.1–1.0)
Monocytes Relative: 6 %
Neutro Abs: 2.7 10*3/uL (ref 1.7–7.7)
Neutrophils Relative %: 50 %
Platelets: 158 10*3/uL (ref 150–400)
RBC: 4.09 MIL/uL — ABNORMAL LOW (ref 4.22–5.81)
RDW: 14.7 % (ref 11.5–15.5)
WBC: 5.3 10*3/uL (ref 4.0–10.5)
nRBC: 0 % (ref 0.0–0.2)

## 2022-04-07 LAB — CK: Total CK: 107 U/L (ref 49–397)

## 2022-04-07 NOTE — ED Provider Notes (Signed)
[facility] Provider Note    Event Date/Time   First MD Initiated Contact with Patient 04/07/22 [phone]     (approximate)   History   Leg Swelling   HPI  Roger Richard is a 41 y.o. male   presents to the ED with complaint of bilateral calf swelling that generally is worse in the mornings and it is during the day.  Patient states that is happened approximately 5-6 times in the last month and a half.  He denies any injury to his legs, extended travels or previous DVT.  He denies any shortness of breath and is not on any medication.  Patient does have a history of smoking 1/2 pack cigarettes per day.      Physical Exam   Triage Vital Signs: ED Triage Vitals [04/07/22 0837]  Enc Vitals Group     BP 118/81     Pulse Rate 96     Resp 14     Temp 98.4 F (36.9 C)     Temp Source Oral     SpO2 100 %     Weight 135 lb (61.2 kg)     Height 5\' 8"  (1.727 m)     Head Circumference      Peak Flow      Pain Score 0     Pain Loc      Pain Edu?      Excl. in GC?     Most recent vital signs: Vitals:   04/07/22 0837  BP: 118/81  Pulse: 96  Resp: 14  Temp: 98.4 F (36.9 C)  SpO2: 100%     General: Awake, no distress.  CV:  Good peripheral perfusion.  Heart regular rate and rhythm. Resp:  Normal effort.  Lungs are clear bilaterally. Abd:  No distention.  Other:  There is minimal bilateral pitting edema present along the tibia but no erythema, warmth or Homans' sign.  Skin is warm and dry.  Pulses are equal bilaterally both DP and TP.  Able to move digits without any difficulty.   ED Results / Procedures / Treatments   Labs (all labs ordered are listed, but only abnormal results are displayed) Labs Reviewed  COMPREHENSIVE METABOLIC PANEL - Abnormal; Notable for the following components:      Result Value   Potassium 3.4 (*)    Glucose, Bld 129 (*)    Calcium 8.1 (*)    Total Protein 5.1 (*)    Albumin 2.6 (*)    All other components within  normal limits  CBC WITH DIFFERENTIAL/PLATELET - Abnormal; Notable for the following components:   RBC 4.09 (*)    Hemoglobin 12.5 (*)    HCT 37.2 (*)    All other components within normal limits  URINALYSIS, ROUTINE W REFLEX MICROSCOPIC - Abnormal; Notable for the following components:   Color, Urine STRAW (*)    APPearance CLEAR (*)    Specific Gravity, Urine 1.001 (*)    All other components within normal limits  CK      RADIOLOGY Venous ultrasound bilateral lower extremities per radiologist were negative for DVT.    PROCEDURES:  Critical Care performed:   Procedures   MEDICATIONS ORDERED IN ED: Medications - No data to display   IMPRESSION / MDM / ASSESSMENT AND PLAN / ED COURSE  I reviewed the triage vital signs and the nursing notes.   Differential diagnosis includes, but is not limited to, edema lower extremities, DVT bilaterally, musculoskeletal pain lower  extremities bilaterally.  41 year old male presents to the ED with complaint of bilateral lower extremity discomfort and swelling for several weeks.  Patient states that he has not been on extended trip, no injury to his lower extremities, no shortness of breath or previous DVT.  There is some minimal pitting edema on exam and patient states that usually it is worse in the mornings and it is in the afternoons.  He is very anxious that this could be a blood clot.  Presently the only risk factor is that he is a smoker.  Venous ultrasound was performed and was negative for DVT which was reassuring to the patient.  Urinalysis was clear.  WBC 5.3 and hemoglobin and hematocrit was 12.5 and 37.2 respectively.  Potassium was minimally decreased at 3.4 and nonfasting glucose was 129.  Patient was made aware that his potassium could be corrected without medication with potassium rich foods and he agrees to try this.  He will follow-up with his PCP for this.  Patient was also given a list of clinics that he can contact as he  currently does not have a PCP.  Patient is encouraged to decreasing the amount of salt and high sodium containing foods.  He may take Tylenol or ibuprofen as needed for discomfort.  He will return to the emergency department if any severe worsening of his symptoms until he is able to get a PCP.      Patient's presentation is most consistent with Patient's presentation is most consistent with acute presentation with potential threat to life or bodily function.    FINAL CLINICAL IMPRESSION(S) / ED DIAGNOSES   Final diagnoses:  Leg swelling     Rx / DC Orders   ED Discharge Orders     None        Note:  This document was prepared using Dragon voice recognition software and may include unintentional dictation errors.   Tommi Rumps, PA-C 04/07/22 1209    Gilles Chiquito, MD 04/07/22 [phone]

## 2022-04-07 NOTE — ED Triage Notes (Signed)
Pt reports Bilt calf swelling, pain denies pain. No ankle swelling. No SOB.

## 2022-04-07 NOTE — Discharge Instructions (Addendum)
Follow-up with your primary care provider.  If you do not already have a primary care provider you should get established with someone.  A list of clinics is listed on your discharge papers including [facility] which [facility].  You will need to call and most likely fill out papers.  Elevate your feet when needed for swelling.  Tylenol or ibuprofen as needed.  Also decrease the amount of salt intake or foods that contain a high sodium level as this may be one of the reasons why your legs are retaining fluid.
